# Patient Record
Sex: Female | Born: 1960 | Race: Black or African American | Hispanic: No | Marital: Single | Smoking: Former smoker
Health system: Southern US, Community
[De-identification: ages and names within clinical notes are randomized; demographics above are authoritative.]

## PROBLEM LIST (undated history)

## (undated) DIAGNOSIS — F32A Depression, unspecified: Secondary | ICD-10-CM

## (undated) DIAGNOSIS — F329 Major depressive disorder, single episode, unspecified: Secondary | ICD-10-CM

## (undated) HISTORY — DX: Depression, unspecified: F32.A

---
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
## 2010-03-09 ENCOUNTER — Emergency Department (HOSPITAL_BASED_OUTPATIENT_CLINIC_OR_DEPARTMENT_OTHER): Admission: EM | Admit: 2010-03-09 | Discharge: 2010-03-09 | Payer: Self-pay | Admitting: Emergency Medicine

## 2010-10-28 ENCOUNTER — Emergency Department (HOSPITAL_BASED_OUTPATIENT_CLINIC_OR_DEPARTMENT_OTHER)
Admission: EM | Admit: 2010-10-28 | Discharge: 2010-10-28 | Disposition: A | Discharge status: Home / Self Care | Payer: Medicaid Other | Attending: Emergency Medicine | Admitting: Emergency Medicine

## 2010-10-28 DIAGNOSIS — IMO0002 Reserved for concepts with insufficient information to code with codable children: Secondary | ICD-10-CM | POA: Insufficient documentation

## 2010-10-28 DIAGNOSIS — Y9210 Unspecified residential institution as the place of occurrence of the external cause: Secondary | ICD-10-CM | POA: Insufficient documentation

## 2010-10-28 DIAGNOSIS — F17200 Nicotine dependence, unspecified, uncomplicated: Secondary | ICD-10-CM | POA: Insufficient documentation

## 2010-10-28 DIAGNOSIS — M25539 Pain in unspecified wrist: Secondary | ICD-10-CM | POA: Insufficient documentation

## 2011-07-01 ENCOUNTER — Emergency Department (HOSPITAL_BASED_OUTPATIENT_CLINIC_OR_DEPARTMENT_OTHER)
Admission: EM | Admit: 2011-07-01 | Discharge: 2011-07-01 | Disposition: A | Discharge status: Home / Self Care | Payer: Self-pay | Attending: Emergency Medicine | Admitting: Emergency Medicine

## 2011-07-01 ENCOUNTER — Other Ambulatory Visit: Payer: Self-pay

## 2011-07-01 ENCOUNTER — Emergency Department (INDEPENDENT_AMBULATORY_CARE_PROVIDER_SITE_OTHER): Payer: Self-pay

## 2011-07-01 ENCOUNTER — Encounter (HOSPITAL_BASED_OUTPATIENT_CLINIC_OR_DEPARTMENT_OTHER): Payer: Self-pay | Admitting: *Deleted

## 2011-07-01 DIAGNOSIS — R059 Cough, unspecified: Secondary | ICD-10-CM | POA: Insufficient documentation

## 2011-07-01 DIAGNOSIS — R079 Chest pain, unspecified: Secondary | ICD-10-CM

## 2011-07-01 DIAGNOSIS — R071 Chest pain on breathing: Secondary | ICD-10-CM | POA: Insufficient documentation

## 2011-07-01 DIAGNOSIS — R05 Cough: Secondary | ICD-10-CM | POA: Insufficient documentation

## 2011-07-01 DIAGNOSIS — R0789 Other chest pain: Secondary | ICD-10-CM

## 2011-07-01 LAB — DIFFERENTIAL
Basophils Absolute: 0 10*3/uL (ref 0.0–0.1)
Basophils Relative: 0 % (ref 0–1)
Eosinophils Absolute: 0 10*3/uL (ref 0.0–0.7)
Eosinophils Relative: 0 % (ref 0–5)
Lymphocytes Relative: 31 % (ref 12–46)
Monocytes Absolute: 0.7 10*3/uL (ref 0.1–1.0)

## 2011-07-01 LAB — CBC
HCT: 32.7 % — ABNORMAL LOW (ref 36.0–46.0)
MCH: 30.7 pg (ref 26.0–34.0)
MCHC: 34.9 g/dL (ref 30.0–36.0)
MCV: 88.1 fL (ref 78.0–100.0)
Platelets: 282 10*3/uL (ref 150–400)
RDW: 13.5 % (ref 11.5–15.5)

## 2011-07-01 LAB — BASIC METABOLIC PANEL
CO2: 25 mEq/L (ref 19–32)
Calcium: 10.1 mg/dL (ref 8.4–10.5)
Creatinine, Ser: 0.6 mg/dL (ref 0.50–1.10)
GFR calc non Af Amer: >90 mL/min (ref >90)
Sodium: 138 mEq/L (ref 135–145)

## 2011-07-01 LAB — CARDIAC PANEL(CRET KIN+CKTOT+MB+TROPI): Total CK: 421 U/L — ABNORMAL HIGH (ref 7–177)

## 2011-07-01 MED ORDER — TRAMADOL HCL 50 MG PO TABS: 50.0000 mg | ORAL_TABLET | Freq: Four times a day (QID) | ORAL | Status: AC | PRN

## 2011-07-01 MED ORDER — IBUPROFEN 800 MG PO TABS
800.0000 mg | ORAL_TABLET | Freq: Once | ORAL | Status: AC
  Administered 2011-07-01: 800 mg via ORAL
  Filled 2011-07-01: qty 1

## 2011-07-01 MED ORDER — NAPROXEN 500 MG PO TABS: 500.0000 mg | ORAL_TABLET | Freq: Two times a day (BID) | ORAL | Status: AC

## 2011-07-01 MED ORDER — BENZONATATE 100 MG PO CAPS: 100.0000 mg | ORAL_CAPSULE | Freq: Three times a day (TID) | ORAL | Status: AC

## 2011-07-01 NOTE — Discharge Instructions (Signed)
Chest Wall Pain Chest wall pain is pain in or around the bones and muscles of your chest. This may occur:   On its own (spontaneously).   After a viral illness such as the flu.   Through injur.   From coughing.   Minor exercise.  It may take up to 6 weeks to get better; longer if you must stay physically active in your work and activities. HOME CARE INSTRUCTIONS   Avoid over-tiring physical activity. Try not to strain or perform activities which cause pain. This would include any activities using chest, belly (abdominal) and side muscles, especially if heavy weights are used.   Use ice on the painful area for 15 to 20 minutes per hour while awake for the first 2 days. Place the ice in a plastic bag and place a towel between the bag of ice and your skin.   Only take over-the-counter or prescription medicines for pain, discomfort, or fever as directed by your caregiver.  SEEK IMMEDIATE MEDICAL CARE IF:   Your pain increases or you are very uncomfortable.   An oral temperature above 102 F (38.9 C)develops.   Your chest pains become worse.   You develop new, unexplained problems (symptoms).   You develop nausea, vomiting, sweating or feel light headed.   You develop a cough which produces phlegm (sputum) or you cough up blood.  MAKE SURE YOU:   Understand these instructions.   Will watch your condition.   Will get help right away if you are not doing well or get worse.  Document Released: 04/17/2005 Document Revised: 10/31/2010 Document Reviewed: 12/04/2007 Exit[employer] Patient Information 2012 [employer]. 

## 2011-07-01 NOTE — ED Notes (Signed)
Pt states she has had a cough for 3 weeks. 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 Increased with movement. Denies other s/s.

## 2011-07-01 NOTE — ED Provider Notes (Signed)
History   This chart was scribed for Dayton Bailiff, MD by Charolett Bumpers . The patient was seen in room MH11/MH11 and the patient's care was started at 4:00pm.   CSN: [id number]  Arrival date & time 07/01/11  1539   First MD Initiated Contact with Patient 07/01/11 1557      Chief Complaint  Patient presents with  . Chest Pain    (Consider location/radiation/quality/duration/timing/severity/associated sxs/prior treatment) HPI Carol Ramirez is a 51 y.o. female who presents to the Emergency Department complaining of constant, moderate right-sided chest pain that started earlier today with sudden onset. Patient reports that she does a lot of lifting at work. Patient states that the chest pain is made worse with movements and lifting, but is not made worse with breathing. Patient states that she has had a cough for the past 3 weeks that has not improved. Patient reports no other associated symptoms. No pertinent medical hx noted.  Presents because she is extra concerned because she recently lost her aunt to a massive MI.   History reviewed. No pertinent past medical history.  History reviewed. No pertinent past surgical history.  History reviewed. No pertinent family history.  History  Substance Use Topics  . Smoking status: Current Some Day Smoker  . Smokeless tobacco: Not on file  . Alcohol Use: Yes    OB History    Grav Para Term Preterm Abortions TAB SAB Ect Mult Living                  Review of Systems  Constitutional: Negative for fever, chills, activity change, appetite change and fatigue.  HENT: Negative for congestion, sore throat, rhinorrhea, neck pain and neck stiffness.   Respiratory: Negative for cough and shortness of breath.   Cardiovascular: Positive for chest pain. Negative for palpitations.  Gastrointestinal: Negative for nausea, vomiting and abdominal pain.  Genitourinary: Negative for dysuria, urgency, frequency and flank pain.  Musculoskeletal:  Negative for myalgias, back pain and arthralgias.  Neurological: Negative for dizziness, weakness, light-headedness, numbness and headaches.  All other systems reviewed and are negative.   A complete 10 system review of systems was obtained and is otherwise negative except as noted in the HPI and PMH.   Allergies  Review of patient's allergies indicates no known allergies.  Home Medications   Current Outpatient Rx  Name Route Sig Dispense Refill  . ADULT MULTIVITAMIN W/MINERALS CH Oral Take 1 tablet by mouth daily.    Marland Kitchen NAPROXEN 500 MG PO TABS Oral Take 1 tablet (500 mg total) by mouth 2 (two) times daily. 30 tablet 0  . TRAMADOL HCL 50 MG PO TABS Oral Take 1 tablet (50 mg total) by mouth every 6 (six) hours as needed for pain. 15 tablet 0    BP 162/87  Pulse 86  Temp(Src) 98.5 F (36.9 C) (Oral)  Resp 20  Ht 5\' 4"  (1.626 m)  Wt 149 lb (67.586 kg)  BMI 25.58 kg/m2  SpO2 100%  LMP 06/23/2011  Physical Exam  Nursing note and vitals reviewed. Constitutional: She is oriented to person, place, and time. She appears well-developed and well-nourished. No distress.  HENT:  Head: Normocephalic and atraumatic.  Eyes: EOM are normal. Pupils are equal, round, and reactive to light.  Neck: Normal range of motion. Neck supple. No tracheal deviation present.  Cardiovascular: Normal rate, regular rhythm and normal heart sounds.  Exam reveals no gallop and no friction rub.   No murmur heard. Pulmonary/Chest: Effort normal and  breath sounds normal. No respiratory distress. She has no wheezes. She has no rales. She exhibits tenderness (Right-sided chest tenderness. ).  Abdominal: Soft. Bowel sounds are normal. She exhibits no distension. There is no tenderness.  Musculoskeletal: Normal range of motion. She exhibits no edema.  Neurological: She is alert and oriented to person, place, and time. No sensory deficit.  Skin: Skin is warm and dry.  Psychiatric: She has a normal mood and affect. Her  behavior is normal.    ED Course  Procedures (including critical care time)   Date: 07/01/2011  Rate: 85  Rhythm: normal sinus rhythm  QRS Axis: normal  Intervals: normal  ST/T Wave abnormalities: normal  Conduction Disutrbances:none  Narrative Interpretation:   Old EKG Reviewed: none available  DIAGNOSTIC STUDIES: Oxygen Saturation is 100% on room air, normal by my interpretation.    COORDINATION OF CARE:  1615: Medication Orders: Ibuprofen tablet 800 mg-once.  1730: Recheck: Patient was informed of imaging and lab results.   Labs Reviewed  CBC - Abnormal; Notable for the following:    RBC 3.71 (*)    Hemoglobin 11.4 (*)    HCT 32.7 (*)    All other components within normal limits  CARDIAC PANEL(CRET KIN+CKTOT+MB+TROPI) - Abnormal; Notable for the following:    Total CK 421 (*)    CK, MB 4.2 (*)    All other components within normal limits  DIFFERENTIAL  BASIC METABOLIC PANEL   Dg Chest 2 View  07/01/2011  *RADIOLOGY REPORT*  Clinical Data: Chest pain.  Occasional smoker.  CHEST - 2 VIEW  Comparison: None  Findings: The heart size and mediastinal contours are within normal limits.  Both lungs are clear.  The visualized skeletal structures are unremarkable.  IMPRESSION: Negative exam.  Original Report Authenticated By: Rosealee Albee, M.D.     1. Chest wall pain       MDM  Consistent with chest wall pain. The patient has PE RC negative with low clinical Gestalt for pulmonary embolus. No concern for ACS. Single troponin was performed and negative. She had pain for greater than 6 hours continuously. She'll be discharged home with pain medication. Instructed to followup with her primary care physician   I personally performed the services described in this documentation, which was scribed in my presence. The recorded information has been reviewed and considered.    Dayton Bailiff, MD 07/01/11 [phone]

## 2012-12-18 ENCOUNTER — Emergency Department (HOSPITAL_BASED_OUTPATIENT_CLINIC_OR_DEPARTMENT_OTHER)
Admission: EM | Admit: 2012-12-18 | Discharge: 2012-12-18 | Disposition: A | Discharge status: Home / Self Care | Payer: Medicaid Other | Attending: Emergency Medicine | Admitting: Emergency Medicine

## 2012-12-18 ENCOUNTER — Encounter (HOSPITAL_BASED_OUTPATIENT_CLINIC_OR_DEPARTMENT_OTHER): Payer: Self-pay | Admitting: Emergency Medicine

## 2012-12-18 DIAGNOSIS — Z87891 Personal history of nicotine dependence: Secondary | ICD-10-CM | POA: Insufficient documentation

## 2012-12-18 DIAGNOSIS — Z8669 Personal history of other diseases of the nervous system and sense organs: Secondary | ICD-10-CM | POA: Insufficient documentation

## 2012-12-18 DIAGNOSIS — M79641 Pain in right hand: Secondary | ICD-10-CM

## 2012-12-18 DIAGNOSIS — M25449 Effusion, unspecified hand: Secondary | ICD-10-CM | POA: Insufficient documentation

## 2012-12-18 DIAGNOSIS — IMO0001 Reserved for inherently not codable concepts without codable children: Secondary | ICD-10-CM | POA: Insufficient documentation

## 2012-12-18 DIAGNOSIS — Z79899 Other long term (current) drug therapy: Secondary | ICD-10-CM | POA: Insufficient documentation

## 2012-12-18 DIAGNOSIS — M79609 Pain in unspecified limb: Secondary | ICD-10-CM | POA: Insufficient documentation

## 2012-12-18 MED ORDER — IBUPROFEN 800 MG PO TABS
800.0000 mg | ORAL_TABLET | Freq: Once | ORAL | Status: AC
  Administered 2012-12-18: 800 mg via ORAL
  Filled 2012-12-18: qty 1

## 2012-12-18 MED ORDER — NAPROXEN 500 MG PO TABS: 500.0000 mg | ORAL_TABLET | Freq: Two times a day (BID) | ORAL | Status: DC

## 2012-12-18 NOTE — ED Notes (Signed)
Complaining of bilateral hand numbness(thinks is carpal tunnel) has repetitive motion work lifting.

## 2012-12-18 NOTE — ED Provider Notes (Signed)
  CSN: [id number]     Arrival date & time 12/18/12  1428 History     First MD Initiated Contact with Patient 12/18/12 1454     Chief Complaint  Patient presents with  . Hand Pain   (Consider location/radiation/quality/duration/timing/severity/associated sxs/prior Treatment) Patient is a 52 y.o. female presenting with hand pain. The history is provided by the patient. No language interpreter was used.  Hand Pain This is a new problem. The current episode started 1 to 4 weeks ago. The problem occurs constantly. The problem has been gradually worsening. Associated symptoms include joint swelling and myalgias. Nothing aggravates the symptoms. She has tried nothing for the symptoms. The treatment provided moderate relief.  Pt has a history of carpal tunnel.  Pt saw hand md in the past.    History reviewed. No pertinent past medical history. History reviewed. No pertinent past surgical history. No family history on file. History  Substance Use Topics  . Smoking status: Former Smoker    Types: Cigarettes  . Smokeless tobacco: Not on file  . Alcohol Use: 1.2 oz/week    2 Cans of beer per week     Comment: weekly   OB History   Grav Para Term Preterm Abortions TAB SAB Ect Mult Living                 Review of Systems  Musculoskeletal: Positive for myalgias and joint swelling.  All other systems reviewed and are negative.    Allergies  Review of patient's allergies indicates no known allergies.  Home Medications   Current Outpatient Rx  Name  Route  Sig  Dispense  Refill  . Multiple Vitamin (MULITIVITAMIN WITH MINERALS) TABS   Oral   Take 1 tablet by mouth daily.          BP 170/84  Pulse 70  Temp(Src) 98.1 F (36.7 C) (Oral)  Resp 16  Ht 5\' 4"  (1.626 m)  Wt 152 lb (68.947 kg)  BMI 26.08 kg/m2  SpO2 100%  LMP 12/18/2012 Physical Exam  Vitals reviewed. Constitutional: She is oriented to person, place, and time. She appears well-developed and well-nourished.   Musculoskeletal: She exhibits tenderness.  Swollen bilat hands,  No erythema,   From,  nv and ns intact  Neurological: She is alert and oriented to person, place, and time. She has normal reflexes.  Skin: Skin is warm.  Psychiatric: She has a normal mood and affect.    ED Course   Procedures (including critical care time)  Labs Reviewed - No data to display No results found. 1. Bilateral hand pain     MDM  Pt advised to wear wrist splints.  Follow up with Hand surgeon for evaluation   Elson Areas, PA-C 12/18/12 1529

## 2012-12-19 NOTE — ED Provider Notes (Signed)
History/physical exam/procedure(s) were performed by non-physician practitioner and as supervising physician I was immediately available for consultation/collaboration. I have reviewed all notes and am in agreement with care and plan.   Hilario Quarry, MD 12/19/12 [phone]

## 2018-02-28 ENCOUNTER — Encounter: Payer: Self-pay | Admitting: Family Medicine

## 2018-09-06 ENCOUNTER — Ambulatory Visit (INDEPENDENT_AMBULATORY_CARE_PROVIDER_SITE_OTHER): Payer: BLUE CROSS/BLUE SHIELD | Admitting: Family Medicine

## 2018-09-06 ENCOUNTER — Other Ambulatory Visit: Payer: Self-pay

## 2018-09-06 ENCOUNTER — Encounter: Payer: Self-pay | Admitting: Family Medicine

## 2018-09-06 VITALS — BP 132/76 | HR 88 | Ht 63.5 in | Wt 136.0 lb

## 2018-09-06 DIAGNOSIS — Z1151 Encounter for screening for human papillomavirus (HPV): Secondary | ICD-10-CM

## 2018-09-06 DIAGNOSIS — Z124 Encounter for screening for malignant neoplasm of cervix: Secondary | ICD-10-CM

## 2018-09-06 DIAGNOSIS — Z01419 Encounter for gynecological examination (general) (routine) without abnormal findings: Secondary | ICD-10-CM | POA: Diagnosis not present

## 2018-09-06 NOTE — Progress Notes (Signed)
GYNECOLOGY ANNUAL PREVENTATIVE CARE ENCOUNTER NOTE  Subjective:   Carol Ramirez is a 58 y.o. G7P1001 female here for a routine annual gynecologic exam.  Current complaints: none.   Denies abnormal vaginal bleeding, discharge, pelvic pain, problems with intercourse or other gynecologic concerns.    Gynecologic History No LMP recorded. (Menstrual status: Perimenopausal). Patient is sexually active  Contraception: post menopausal status Last Pap: uncertain, but several years ago. Results were: normal Last mammogram: none  Obstetric History OB History  Gravida Para Term Preterm AB Living  '1 1 1     1  ' SAB TAB Ectopic Multiple Live Births          1    # Outcome Date GA Lbr Len/2nd Weight Sex Delivery Anes PTL Lv  1 Term 1996 [redacted]w[redacted]d  M CS-LTranv EPI N LIV    History reviewed. No pertinent past medical history.  History reviewed. No pertinent surgical history.  Current Outpatient Medications on File Prior to Visit  Medication Sig Dispense Refill  . Multiple Vitamin (MULITIVITAMIN WITH MINERALS) TABS Take 1 tablet by mouth daily.    . naproxen (NAPROSYN) 500 MG tablet Take 1 tablet (500 mg total) by mouth 2 (two) times daily. (Patient not taking: Reported on 09/06/2018) 30 tablet 0   No current facility-administered medications on file prior to visit.     No Known Allergies  Social History   Socioeconomic History  . Marital status: Single    Spouse name: Not on file  . Number of children: Not on file  . Years of education: Not on file  . Highest education level: Not on file  Occupational History  . Not on file  Social Needs  . Financial resource strain: Not on file  . Food insecurity:    Worry: Not on file    Inability: Not on file  . Transportation needs:    Medical: Not on file    Non-medical: Not on file  Tobacco Use  . Smoking status: Former Smoker    Types: Cigarettes  . Smokeless tobacco: Never Used  Substance and Sexual Activity  . Alcohol use: Yes     Alcohol/week: 2.0 standard drinks    Types: 2 Cans of beer per week    Comment: weekly  . Drug use: No  . Sexual activity: Yes    Birth control/protection: None  Lifestyle  . Physical activity:    Days per week: Not on file    Minutes per session: Not on file  . Stress: Not on file  Relationships  . Social connections:    Talks on phone: Not on file    Gets together: Not on file    Attends religious service: Not on file    Active member of club or organization: Not on file    Attends meetings of clubs or organizations: Not on file    Relationship status: Not on file  . Intimate partner violence:    Fear of current or ex partner: Not on file    Emotionally abused: Not on file    Physically abused: Not on file    Forced sexual activity: Not on file  Other Topics Concern  . Not on file  Social History Narrative  . Not on file    Family History  Problem Relation Age of Onset  . Diabetes Mother     The following portions of the patient's history were reviewed and updated as appropriate: allergies, current medications, past family history, past  medical history, past social history, past surgical history and problem list.  Review of Systems Pertinent items are noted in HPI.   Objective:  BP 132/76   Pulse 88   Ht 5' 3.5" (1.613 m)   Wt 136 lb (61.7 kg)   BMI 23.71 kg/m  Wt Readings from Last 3 Encounters:  09/06/18 136 lb (61.7 kg)  12/18/12 152 lb (68.9 kg)  07/01/11 149 lb (67.6 kg)     CONSTITUTIONAL: Well-developed, well-nourished female in no acute distress.  HENT:  Normocephalic, atraumatic, External right and left ear normal. Oropharynx is clear and moist EYES: Conjunctivae and EOM are normal. Pupils are equal, round, and reactive to light. No scleral icterus.  NECK: Normal range of motion, supple, no masses.  Normal thyroid.   CARDIOVASCULAR: Normal heart rate noted, regular rhythm RESPIRATORY: Clear to auscultation bilaterally. Effort and breath sounds  normal, no problems with respiration noted. BREASTS: Symmetric in size. No masses, skin changes, nipple drainage, or lymphadenopathy. ABDOMEN: Soft, normal bowel sounds, no distention noted.  No tenderness, rebound or guarding.  PELVIC: Normal appearing external genitalia; normal appearing vaginal mucosa and cervix.  No abnormal discharge noted.  Normal uterine size, no other palpable masses, no uterine or adnexal tenderness. MUSCULOSKELETAL: Normal range of motion. No tenderness.  No cyanosis, clubbing, or edema.  2+ distal pulses. SKIN: Skin is warm and dry. No rash noted. Not diaphoretic. No erythema. No pallor. NEUROLOGIC: Alert and oriented to person, place, and time. Normal reflexes, muscle tone coordination. No cranial nerve deficit noted. PSYCHIATRIC: Normal mood and affect. Normal behavior. Normal judgment and thought content.  Assessment:  Annual gynecologic examination with pap smear   Plan:  1. Well Woman Exam Will follow up results of pap smear and manage accordingly. Mammogram scheduled - MM DIGITAL SCREENING BILATERAL; Future - Cytology - PAP( [place]) - CBC - Comp Met ([facility]) - Thyroid Panel With TSH   Routine preventative health maintenance measures emphasized. Please refer to After Visit Summary for other counseling recommendations.    Loma Boston, [place] for [employer]

## 2018-09-06 NOTE — Progress Notes (Signed)
Pt needs a pap smear and mammogram.  Thereasa Iannello l Merlin Ege, CMA

## 2018-09-07 LAB — CBC
Hematocrit: 32.1 % — ABNORMAL LOW (ref 34.0–46.6)
Hemoglobin: 11.4 g/dL (ref 11.1–15.9)
MCH: 31.8 pg (ref 26.6–33.0)
MCHC: 35.5 g/dL (ref 31.5–35.7)
MCV: 89 fL (ref 79–97)
Platelets: 270 10*3/uL (ref 150–450)
RBC: 3.59 x10E6/uL — ABNORMAL LOW (ref 3.77–5.28)
RDW: 13.8 % (ref 11.7–15.4)
WBC: 5.9 10*3/uL (ref 3.4–10.8)

## 2018-09-07 LAB — COMPREHENSIVE METABOLIC PANEL
ALT: 33 IU/L — ABNORMAL HIGH (ref 0–32)
AST: 49 IU/L — ABNORMAL HIGH (ref 0–40)
Albumin/Globulin Ratio: 1.5 (ref 1.2–2.2)
Albumin: 4.7 g/dL (ref 3.8–4.9)
Alkaline Phosphatase: 73 IU/L (ref 39–117)
BUN/Creatinine Ratio: 12 (ref 9–23)
BUN: 9 mg/dL (ref 6–24)
Bilirubin Total: 0.4 mg/dL (ref 0.0–1.2)
CO2: 20 mmol/L (ref 20–29)
Calcium: 9.8 mg/dL (ref 8.7–10.2)
Chloride: 102 mmol/L (ref 96–106)
Creatinine, Ser: 0.73 mg/dL (ref 0.57–1.00)
GFR calc Af Amer: 107 mL/min/{1.73_m2} (ref >59)
GFR calc non Af Amer: 93 mL/min/{1.73_m2} (ref >59)
Globulin, Total: 3.2 g/dL (ref 1.5–4.5)
Glucose: 98 mg/dL (ref 65–99)
Potassium: 3.8 mmol/L (ref 3.5–5.2)
Sodium: 140 mmol/L (ref 134–144)
Total Protein: 7.9 g/dL (ref 6.0–8.5)

## 2018-09-07 LAB — THYROID PANEL WITH TSH
Free Thyroxine Index: 1.5 (ref 1.2–4.9)
T3 Uptake Ratio: 26 % (ref 24–39)
T4, Total: 5.8 ug/dL (ref 4.5–12.0)
TSH: 3.71 u[IU]/mL (ref 0.450–4.500)

## 2018-09-11 ENCOUNTER — Telehealth: Payer: Self-pay

## 2018-09-11 DIAGNOSIS — R7989 Other specified abnormal findings of blood chemistry: Secondary | ICD-10-CM

## 2018-09-11 LAB — CYTOLOGY - PAP
Adequacy: ABSENT
Diagnosis: NEGATIVE
HPV: NOT DETECTED

## 2018-09-11 NOTE — Telephone Encounter (Signed)
-----   Message from Levie Heritage, DO sent at 09/10/2018  3:22 PM EDT ----- LFTs elevated. She should see a PCP in order to get follow up testing done.

## 2018-09-11 NOTE — Telephone Encounter (Signed)
Called pt with results . Pt made aware that a referral for Primary care was sent to [facility]. Advised pt to call their office if she doesn't receive a call within the next few days.  Understanding was voiced.  chiquita l wilson, CMA

## 2018-09-16 ENCOUNTER — Telehealth: Payer: Self-pay

## 2018-09-16 DIAGNOSIS — B9689 Other specified bacterial agents as the cause of diseases classified elsewhere: Secondary | ICD-10-CM

## 2018-09-16 MED ORDER — METRONIDAZOLE 500 MG PO TABS: 500.0000 mg | ORAL_TABLET | Freq: Two times a day (BID) | ORAL | 0 refills | Status: DC

## 2018-09-16 NOTE — Telephone Encounter (Signed)
Called pt with Pap results. Pt made aware that her pap is negative but she has BV. Medication was sent to pharmacy.  Carol Ramirez l Carol Ramirez, CMA

## 2018-09-19 ENCOUNTER — Inpatient Hospital Stay (HOSPITAL_BASED_OUTPATIENT_CLINIC_OR_DEPARTMENT_OTHER): Admission: RE | Admit: 2018-09-19 | Payer: BLUE CROSS/BLUE SHIELD | Source: Ambulatory Visit

## 2018-09-24 ENCOUNTER — Telehealth: Payer: Self-pay | Admitting: General Practice

## 2018-09-24 NOTE — Telephone Encounter (Signed)
LVM on cell phone for pt to call the office and schedule New pt appt. Pt wanted to schedule new pt appt per her OBGYN in our office. Please schedule with provider Carmelia Roller, [place] or [place] who are taking new pts.

## 2018-10-09 ENCOUNTER — Ambulatory Visit: Payer: BLUE CROSS/BLUE SHIELD | Admitting: Medical

## 2018-10-09 ENCOUNTER — Encounter: Payer: Self-pay | Admitting: Medical

## 2018-10-09 ENCOUNTER — Other Ambulatory Visit: Payer: Self-pay

## 2018-10-09 VITALS — BP 145/85 | HR 74 | Temp 98.6°F | Resp 16 | Ht 64.0 in | Wt 136.0 lb

## 2018-10-09 DIAGNOSIS — R748 Abnormal levels of other serum enzymes: Secondary | ICD-10-CM

## 2018-10-09 DIAGNOSIS — I10 Essential (primary) hypertension: Secondary | ICD-10-CM

## 2018-10-09 LAB — COMPREHENSIVE METABOLIC PANEL
ALT: 28 U/L (ref 0–35)
AST: 41 U/L — ABNORMAL HIGH (ref 0–37)
Albumin: 4.4 g/dL (ref 3.5–5.2)
Alkaline Phosphatase: 66 U/L (ref 39–117)
BUN: 8 mg/dL (ref 6–23)
CO2: 23 mEq/L (ref 19–32)
Calcium: 9.6 mg/dL (ref 8.4–10.5)
Chloride: 102 mEq/L (ref 96–112)
Creatinine, Ser: 0.59 mg/dL (ref 0.40–1.20)
GFR: 127.67 mL/min (ref >60.00)
Glucose, Bld: 94 mg/dL (ref 70–99)
Potassium: 3.5 mEq/L (ref 3.5–5.1)
Sodium: 136 mEq/L (ref 135–145)
Total Bilirubin: 0.4 mg/dL (ref 0.2–1.2)
Total Protein: 7.6 g/dL (ref 6.0–8.3)

## 2018-10-09 LAB — GAMMA GT: GGT: 93 U/L — ABNORMAL HIGH (ref 7–51)

## 2018-10-09 NOTE — Patient Instructions (Addendum)
For your elevated liver enzymes we will get cmp, ggt, hepatitis panel and abd [place] today. I want you to stop drinking alcohol or at least cut back to just one beer a day. If a lot of fat on liver recommend stop completely.  BP elevaated. Check bp daily and bring in readings on follow up. If bp over 140/90 will rx meds. Eat low salt diet. Pt fasting today so will get lipid panel since not done on exam with gyn  Follow up in 10 days or sooner if needed

## 2018-10-09 NOTE — Progress Notes (Signed)
Subjective:    Patient ID: Carol ShinglesBridgette D Ramirez, female    DOB: Mar 26, 1961, 58 y.o.   MRN: [id number]  HPI  Pt has first visit with our office today.  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 Does not work out regularly. Does drink alcohol. Pt stopped smoking one year. Smoked for 10 years. States eat healthy. Drinks sweet tea. Single. One son 623 yo.  Pt had recent wellness exam with gyn. Sent to see us since liver enzymes are mild elevated. Pt drinks about 3 beer a day since 58 yo. Occasional will drink 40 oz beer or 6 pack. No pain in ruq area. No abdomen bloating. No abd pain.   Mild trace anemia.   Thyroid studies recently normal  Pt bp is high today initially. Pt bp has been elevated occasionally in the past. No cardiac or neurologic signs or symptoms.     Review of Systems  Constitutional: Negative for chills, fatigue and fever.  HENT: Negative for congestion, ear discharge and ear pain.   Respiratory: Negative for cough, chest tightness, shortness of breath and wheezing.   Cardiovascular: Negative for chest pain and palpitations.  Gastrointestinal: Negative for abdominal pain.  Genitourinary: Negative for dysuria, flank pain, frequency and urgency.  Musculoskeletal: Negative for back pain.  Skin: Negative for rash.  Neurological: Negative for dizziness, numbness and headaches.  Hematological: Negative for adenopathy. Does not bruise/bleed easily.  Psychiatric/Behavioral: Negative for behavioral problems and confusion.   Past Medical History:  Diagnosis Date  . Depression    briefly in year 2000 releated to work and death of family members.     Social History   Socioeconomic History  . Marital status: Single    Spouse name: Not on file  . Number of children: Not on file  . Years of education: Not on file  . Highest education level: Not on file  Occupational History  . Occupation: Designer, fashion/clothingline cook.  Social Needs  . Financial resource strain: Not on file  . Food insecurity:   Worry: Not on file    Inability: Not on file  . Transportation needs:    Medical: Not on file    Non-medical: Not on file  Tobacco Use  . Smoking status: Former Smoker    Packs/day: 0.25    Years: 10.00    Pack years: 2.50    Types: Cigarettes    Last attempt to quit: 08/08/2008    Years since quitting: 10.1  . Smokeless tobacco: Never Used  Substance and Sexual Activity  . Alcohol use: Yes    Alcohol/week: 3.0 standard drinks    Types: 3 Cans of beer per week    Comment: per day  . Drug use: No  . Sexual activity: Yes    Birth control/protection: None  Lifestyle  . Physical activity:    Days per week: Not on file    Minutes per session: Not on file  . Stress: Not on file  Relationships  . Social connections:    Talks on phone: Not on file    Gets together: Not on file    Attends religious service: Not on file    Active member of club or organization: Not on file    Attends meetings of clubs or organizations: Not on file    Relationship status: Not on file  . Intimate partner violence:    Fear of current or ex partner: Not on file    Emotionally abused: Not on file    Physically abused:  Not on file    Forced sexual activity: Not on file  Other Topics Concern  . Not on file  Social History Narrative  . Not on file    Past Surgical History:  Procedure Laterality Date  . CESAREAN SECTION      Family History  Problem Relation Age of Onset  . Diabetes Mother     No Known Allergies  No current outpatient medications on file prior to visit.   No current facility-administered medications on file prior to visit.     BP (!) 145/85   Pulse 74   Temp 98.6 F (37 C) (Oral)   Resp 16   Ht 5\' 4"  (1.626 m)   Wt 136 lb (61.7 kg)   SpO2 100%   BMI 23.34 kg/m       Objective:   Physical Exam  General Mental Status- Alert. General Appearance- Not in acute distress.   Skin General: Color- Normal Color. Moisture- Normal Moisture.  Neck Carotid Arteries-  Normal color. Moisture- Normal Moisture. No carotid bruits. No JVD.  Chest and Lung Exam Auscultation: Breath Sounds:-Normal.  Cardiovascular Auscultation:Rythm- Regular. Murmurs & Other Heart Sounds:Auscultation of the heart reveals- No Murmurs.  Abdomen Inspection:-Inspeection Normal. Palpation/Percussion:Note:No mass. Palpation and Percussion of the abdomen reveal- Non Tender, Non Distended + BS, no rebound or guarding.   Neurologic Cranial Nerve exam:- CN III-XII intact(No nystagmus), symmetric smile. Strength:- 5/5 equal and symmetric strength both upper and lower extremities.      Assessment & Plan:  For your elevated liver enzymes we will get cmp, ggt, hepatitis panel and abd [place] today. I want you to stop drinking alcohol or at least cut back to just one beer a day. If a lot of fat on liver recommend stop completely.  BP elevaated. Check bp daily and bring in readings on follow up. If bp over 140/90 will rx meds. Eat low salt diet.  Follow up in 10 days or sooner if needed  [employer], PA-C

## 2018-10-10 LAB — HEPATITIS B E ANTIBODY: Hep B E Ab: NONREACTIVE

## 2018-10-10 LAB — HEPATITIS B CORE ANTIBODY, TOTAL: Hep B Core Total Ab: NONREACTIVE

## 2018-10-10 LAB — HEPATITIS A ANTIBODY, TOTAL: Hepatitis A AB,Total: REACTIVE — AB

## 2018-10-10 LAB — HEPATITIS B SURFACE ANTIBODY,QUALITATIVE: Hep B S Ab: NONREACTIVE

## 2018-10-10 LAB — HEPATITIS C ANTIBODY
Hepatitis C Ab: NONREACTIVE
SIGNAL TO CUT-OFF: 0.01 (ref <1.00)

## 2018-10-14 ENCOUNTER — Other Ambulatory Visit: Payer: Self-pay

## 2018-10-14 ENCOUNTER — Ambulatory Visit (HOSPITAL_BASED_OUTPATIENT_CLINIC_OR_DEPARTMENT_OTHER)
Admission: RE | Admit: 2018-10-14 | Discharge: 2018-10-14 | Disposition: A | Discharge status: Home / Self Care | Payer: BLUE CROSS/BLUE SHIELD | Source: Ambulatory Visit | Attending: Medical | Admitting: Medical

## 2018-10-14 ENCOUNTER — Ambulatory Visit (HOSPITAL_BASED_OUTPATIENT_CLINIC_OR_DEPARTMENT_OTHER): Payer: BLUE CROSS/BLUE SHIELD

## 2018-10-14 DIAGNOSIS — R748 Abnormal levels of other serum enzymes: Secondary | ICD-10-CM | POA: Diagnosis present

## 2018-10-17 ENCOUNTER — Ambulatory Visit: Payer: BLUE CROSS/BLUE SHIELD | Admitting: Medical

## 2018-10-18 ENCOUNTER — Ambulatory Visit: Payer: BLUE CROSS/BLUE SHIELD | Admitting: Medical

## 2018-10-26 NOTE — Progress Notes (Signed)
   Subjective:    Patient ID: Carol Ramirez, female    DOB: 08/11/1960, 58 y.o.   MRN: [id number]  HPI     Review of Systems         Objective:           Assessment & Plan:

## 2018-10-28 ENCOUNTER — Ambulatory Visit: Payer: BLUE CROSS/BLUE SHIELD | Admitting: Medical

## 2018-10-30 ENCOUNTER — Other Ambulatory Visit: Payer: Self-pay

## 2018-11-05 ENCOUNTER — Ambulatory Visit: Payer: BLUE CROSS/BLUE SHIELD | Admitting: Medical

## 2019-07-30 IMAGING — US ULTRASOUND ABDOMEN LIMITED
1 series · 14 of 25 positions shown · non-contrast
Comparison: None.

CLINICAL DATA: Elevated liver function tests.

EXAM:
ULTRASOUND ABDOMEN LIMITED RIGHT UPPER QUADRANT

[Series 1: ultrasound abdomen limited · 14 of 70 slices shown]
[im 1/70]
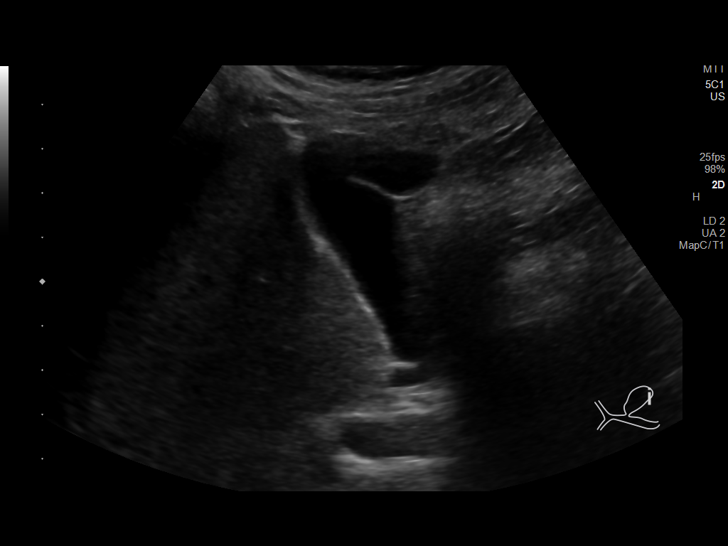
[im 6/70]
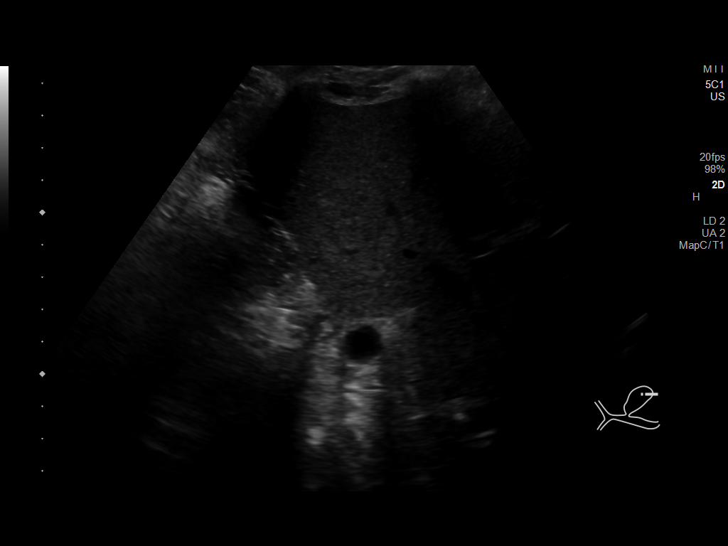
[im 12/70]
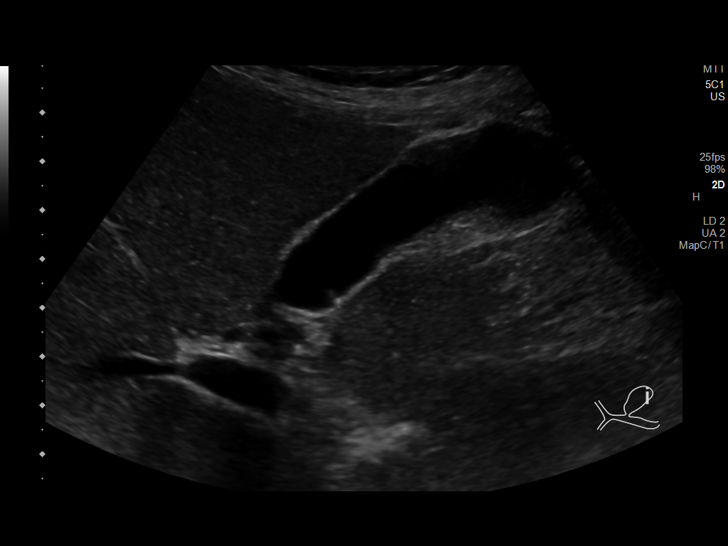
[im 18/70]
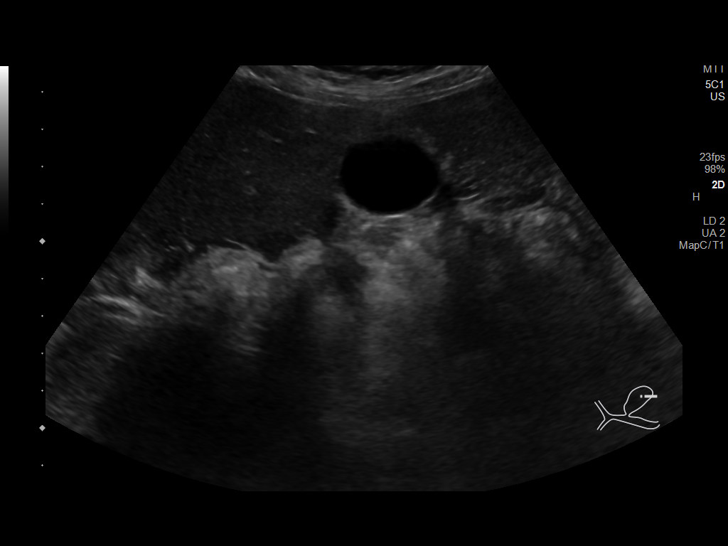
[im 24/70]
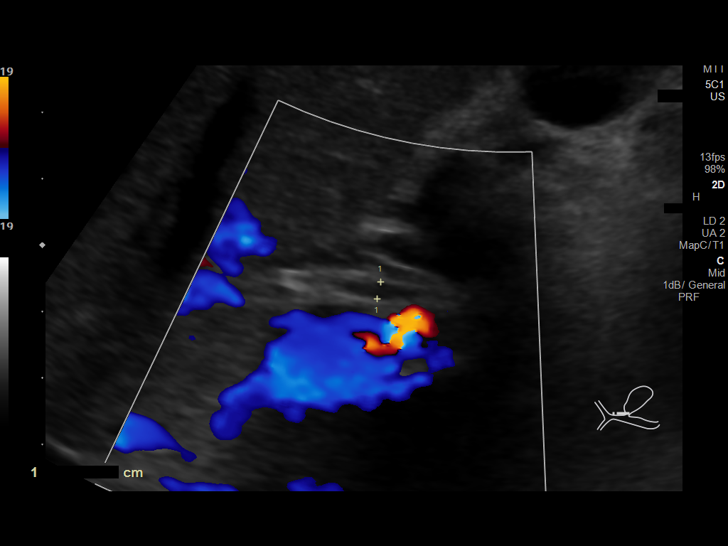
[im 26/70]
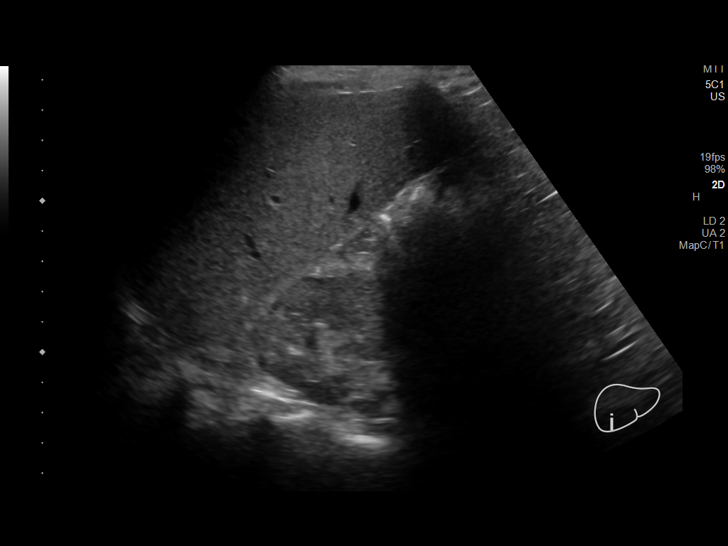
[im 32/70]
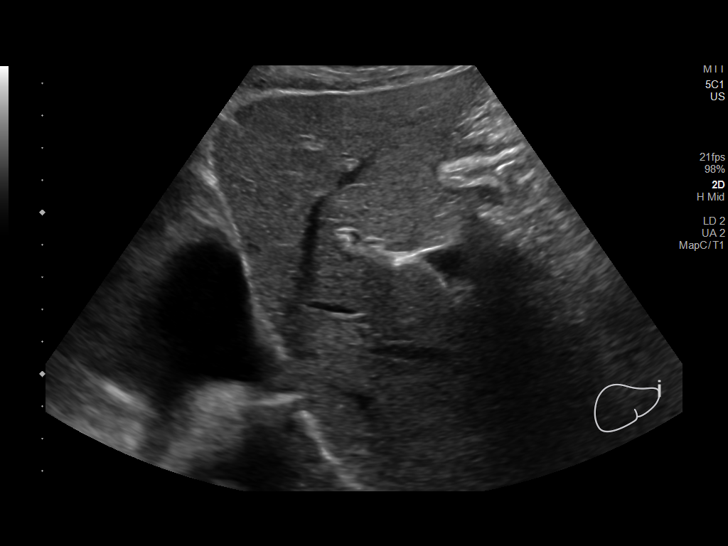
[im 38/70]
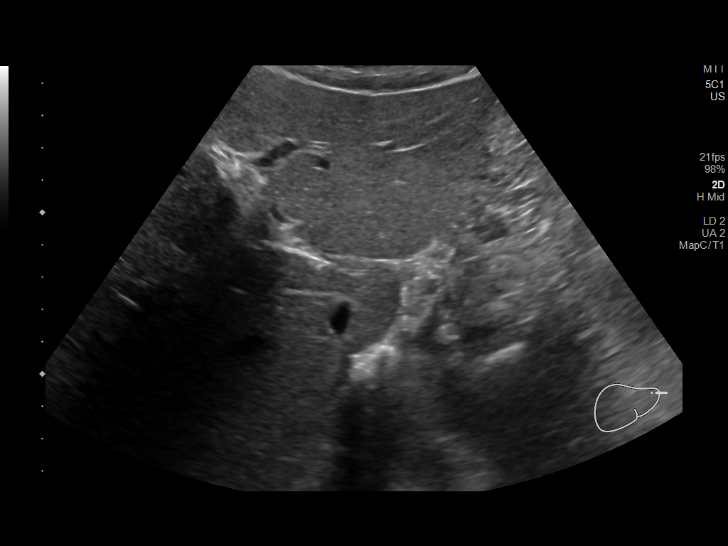
[im 44/70]
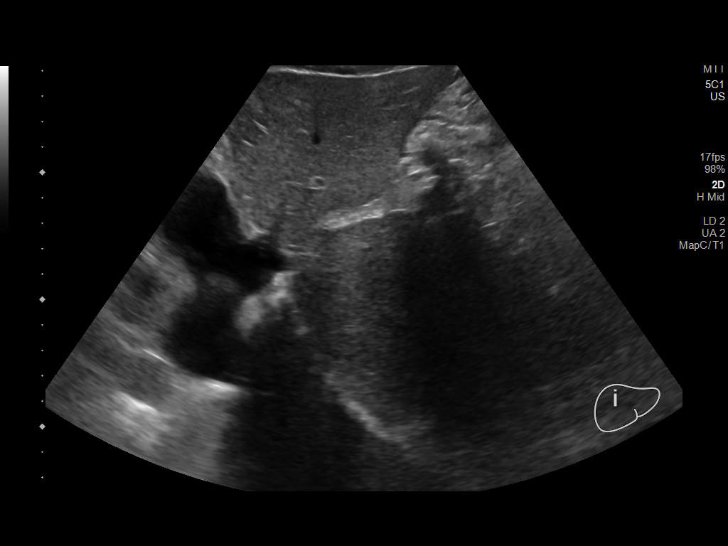
[im 47/70]
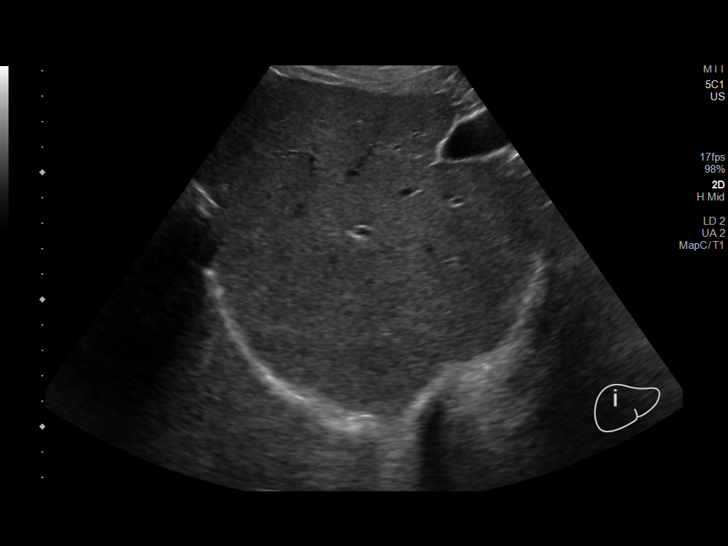
[im 52/70]
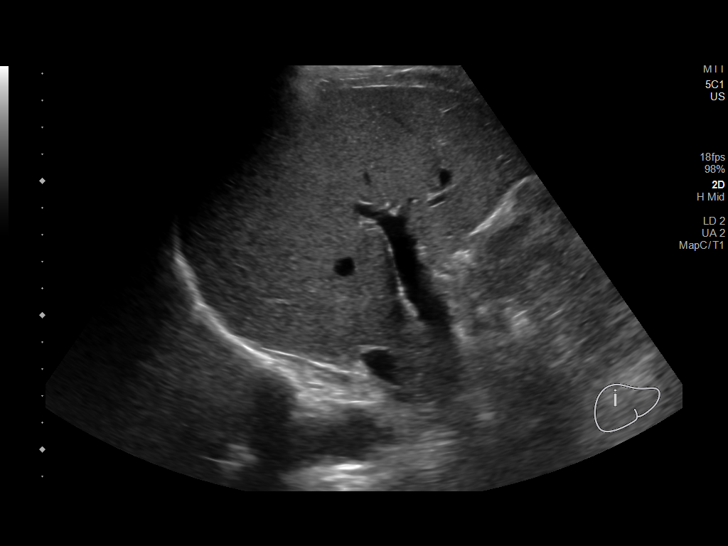
[im 58/70]
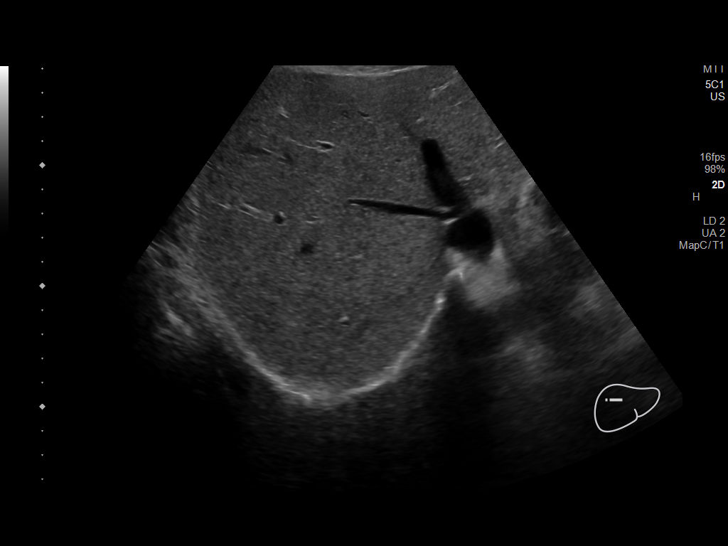
[im 64/70]
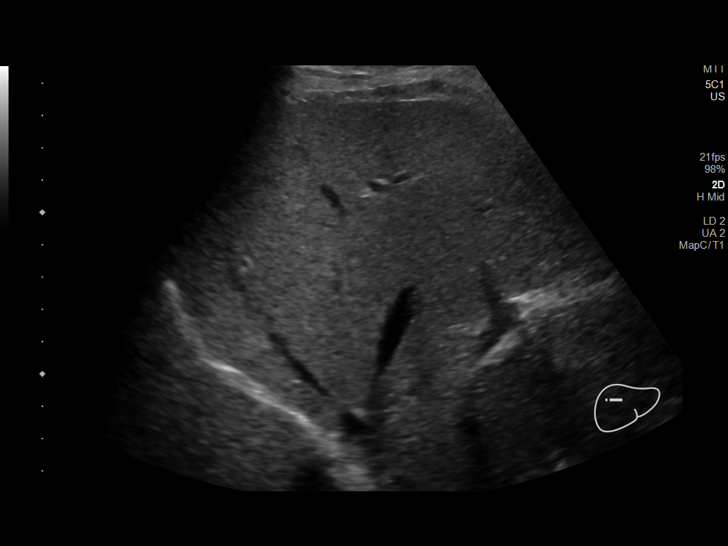
[im 70/70]
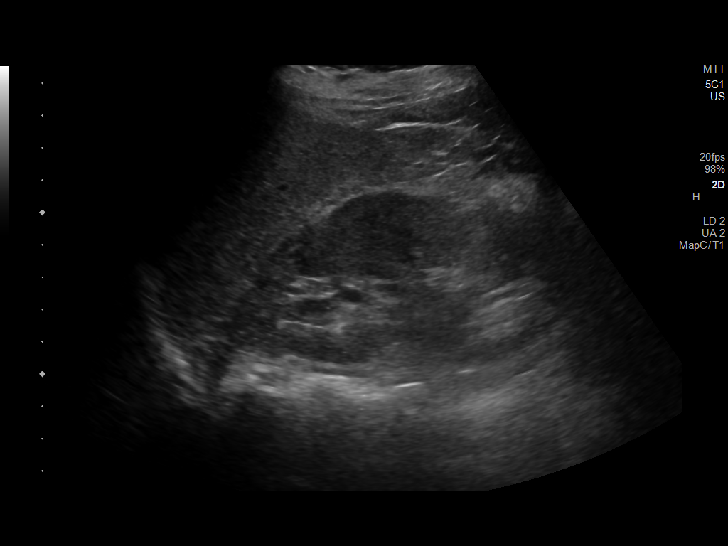

[14 of 25 positions shown; findings below may reference images not displayed]

FINDINGS: Gallbladder:

No gallstones or wall thickening visualized. No sonographic Murphy
sign noted by sonographer.

Common bile duct:

Diameter: 0.3 cm

Liver:

No focal lesion identified. Within normal limits in parenchymal
echogenicity. Portal vein is patent on color Doppler imaging with
normal direction of blood flow towards the liver.
IMPRESSION: Normal exam.  Negative for gallstones.

## 2021-11-01 ENCOUNTER — Emergency Department (HOSPITAL_BASED_OUTPATIENT_CLINIC_OR_DEPARTMENT_OTHER): Payer: Self-pay

## 2021-11-01 ENCOUNTER — Emergency Department (HOSPITAL_BASED_OUTPATIENT_CLINIC_OR_DEPARTMENT_OTHER)
Admission: EM | Admit: 2021-11-01 | Discharge: 2021-11-01 | Disposition: A | Discharge status: Home / Self Care | Payer: Self-pay | Attending: Emergency Medicine | Admitting: Emergency Medicine

## 2021-11-01 ENCOUNTER — Encounter (HOSPITAL_BASED_OUTPATIENT_CLINIC_OR_DEPARTMENT_OTHER): Payer: Self-pay

## 2021-11-01 ENCOUNTER — Other Ambulatory Visit: Payer: Self-pay

## 2021-11-01 DIAGNOSIS — F10920 Alcohol use, unspecified with intoxication, uncomplicated: Secondary | ICD-10-CM | POA: Insufficient documentation

## 2021-11-01 DIAGNOSIS — R0789 Other chest pain: Secondary | ICD-10-CM | POA: Insufficient documentation

## 2021-11-01 DIAGNOSIS — Y908 Blood alcohol level of 240 mg/100 ml or more: Secondary | ICD-10-CM | POA: Insufficient documentation

## 2021-11-01 DIAGNOSIS — Z87891 Personal history of nicotine dependence: Secondary | ICD-10-CM | POA: Insufficient documentation

## 2021-11-01 LAB — BASIC METABOLIC PANEL
Anion gap: 9 (ref 5–15)
BUN: 7 mg/dL (ref 6–20)
CO2: 25 mmol/L (ref 22–32)
Calcium: 9.1 mg/dL (ref 8.9–10.3)
Chloride: 105 mmol/L (ref 98–111)
Creatinine, Ser: 0.67 mg/dL (ref 0.44–1.00)
GFR, Estimated: >60 mL/min (ref >60)
Glucose, Bld: 94 mg/dL (ref 70–99)
Potassium: 3.4 mmol/L — ABNORMAL LOW (ref 3.5–5.1)
Sodium: 139 mmol/L (ref 135–145)

## 2021-11-01 LAB — CBC
HCT: 33.6 % — ABNORMAL LOW (ref 36.0–46.0)
Hemoglobin: 11.9 g/dL — ABNORMAL LOW (ref 12.0–15.0)
MCH: 32.4 pg (ref 26.0–34.0)
MCHC: 35.4 g/dL (ref 30.0–36.0)
MCV: 91.6 fL (ref 80.0–100.0)
Platelets: 228 10*3/uL (ref 150–400)
RBC: 3.67 MIL/uL — ABNORMAL LOW (ref 3.87–5.11)
RDW: 13.3 % (ref 11.5–15.5)
WBC: 6.5 10*3/uL (ref 4.0–10.5)
nRBC: 0 % (ref 0.0–0.2)

## 2021-11-01 LAB — ETHANOL: Alcohol, Ethyl (B): 293 mg/dL — ABNORMAL HIGH (ref <10)

## 2021-11-01 LAB — TROPONIN I (HIGH SENSITIVITY): Troponin I (High Sensitivity): 9 ng/L (ref <18)

## 2021-11-01 MED ORDER — PANTOPRAZOLE SODIUM 40 MG IV SOLR
40.0000 mg | Freq: Once | INTRAVENOUS | Status: AC
Start: 2021-11-01 — End: 2021-11-01
  Administered 2021-11-01: 40 mg via INTRAVENOUS
  Filled 2021-11-01: qty 10

## 2021-11-01 NOTE — ED Triage Notes (Addendum)
Pt to ED by POV from home c/o CP onset of 3 days ago dull in nature. No cardiac hx. Arrives A+O, pt is tearful in triage. Pt also endorses alcohol consumption this evening.

## 2021-11-01 NOTE — ED Provider Notes (Signed)
[facility] Provider Note: Lowella Dell, MD, FACEP  CSN: [id number] MRN: [id number] ARRIVAL: 11/01/21 at 0012 ROOM: [facility]   CHIEF COMPLAINT  Chest Pain   HISTORY OF PRESENT ILLNESS  11/01/21 3:01 AM Carol Ramirez is a 61 y.o. female who has had 3 days of chest pain.  She describes the chest pain as both a pressure and a sensation that she has a pulled muscle.  She is vague about the timing as states the pain is constant but she also that it comes and goes.  She rates the pain as a 3 out of 10 and it is worse with certain movements.  It is not worse with deep breathing.  It is not associated with shortness of breath, nausea or diaphoresis.  She does have a history of GERD but only takes over-the-counter medications.  She acknowledges drinking alcohol earlier.   Past Medical History:  Diagnosis Date   Depression    briefly in year 08/07/98 releated to work and death of family members.    Past Surgical History:  Procedure Laterality Date   CESAREAN SECTION      Family History  Problem Relation Age of Onset   Diabetes Mother     Social History   Tobacco Use   Smoking status: Former    Packs/day: 0.25    Years: 10.00    Total pack years: 2.50    Types: Cigarettes    Quit date: 08/08/2008    Years since quitting: 13.2   Smokeless tobacco: Never  Substance Use Topics   Alcohol use: Yes    Alcohol/week: 3.0 standard drinks of alcohol    Types: 3 Cans of beer per week    Comment: per day   Drug use: No    Prior to Admission medications   Not on File    Allergies Patient has no known allergies.   REVIEW OF SYSTEMS  Negative except as noted here or in the History of Present Illness.   PHYSICAL EXAMINATION  Initial Vital Signs Blood pressure (!) 165/88, pulse 73, temperature 98.2 F (36.8 C), temperature source Oral, resp. rate 16, SpO2 100 %.  Examination General: Well-developed, well-nourished female in no acute distress; appearance  consistent with age of record HENT: normocephalic; atraumatic Eyes: Normal appearance Neck: supple Heart: regular rate and rhythm Lungs: clear to auscultation bilaterally Chest: Nontender Abdomen: soft; nondistended; nontender; bowel sounds present Extremities: No deformity; full range of motion Neurologic: Awake, alert and oriented; motor function intact in all extremities and symmetric; no facial droop Skin: Warm and dry Psychiatric: Normal mood and affect   RESULTS  Summary of this visit's results, reviewed and interpreted by myself:   EKG Interpretation  Date/Time:  Tuesday November 01 2021 00:20:06 EDT Ventricular Rate:  94 PR Interval:  172 QRS Duration: 92 QT Interval:  374 QTC Calculation: 467 R Axis:   54 Text Interpretation: Normal sinus rhythm Cannot rule out Anteroseptal infarct , age undetermined Abnormal ECG Confirmed by Paula Libra ([id number]) on 11/01/2021 3:01:06 AM       Laboratory Studies: Results for orders placed or performed during the hospital encounter of 11/01/21 (from the past 24 hour(s))  Basic metabolic panel     Status: Abnormal   Collection Time: 11/01/21  2:52 AM  Result Value Ref Range   Sodium 139 135 - 145 mmol/L   Potassium 3.4 (L) 3.5 - 5.1 mmol/L   Chloride 105 98 - 111 mmol/L   CO2 25 22 - 32  mmol/L   Glucose, Bld 94 70 - 99 mg/dL   BUN 7 6 - 20 mg/dL   Creatinine, Ser 3.47 0.44 - 1.00 mg/dL   Calcium 9.1 8.9 - 42.5 mg/dL   GFR, Estimated >95 >63 mL/min   Anion gap 9 5 - 15  CBC     Status: Abnormal   Collection Time: 11/01/21  2:52 AM  Result Value Ref Range   WBC 6.5 4.0 - 10.5 K/uL   RBC 3.67 (L) 3.87 - 5.11 MIL/uL   Hemoglobin 11.9 (L) 12.0 - 15.0 g/dL   HCT 87.5 (L) 64.3 - 32.9 %   MCV 91.6 80.0 - 100.0 fL   MCH 32.4 26.0 - 34.0 pg   MCHC 35.4 30.0 - 36.0 g/dL   RDW 51.8 84.1 - 66.0 %   Platelets 228 150 - 400 K/uL   nRBC 0.0 0.0 - 0.2 %  Troponin I (High Sensitivity)     Status: None   Collection Time: 11/01/21  2:52 AM   Result Value Ref Range   Troponin I (High Sensitivity) 9 <18 ng/L  Ethanol     Status: Abnormal   Collection Time: 11/01/21  3:16 AM  Result Value Ref Range   Alcohol, Ethyl (B) 293 (H) <10 mg/dL   Imaging Studies: DG Chest 2 View  Result Date: 11/01/2021 CLINICAL DATA:  Chest pain. EXAM: CHEST - 2 VIEW COMPARISON:  07/01/2011 FINDINGS: The heart size and mediastinal contours are within normal limits. No consolidation, effusion, or pneumothorax. No acute osseous abnormality. IMPRESSION: No active cardiopulmonary disease. Electronically Signed   By: Thornell Sartorius M.D.   On: 11/01/2021 00:38    ED COURSE and MDM  Nursing notes, initial and subsequent vitals signs, including pulse oximetry, reviewed and interpreted by myself.  Vitals:   11/01/21 0024 11/01/21 0251  BP: (!) 159/89 (!) 165/88  Pulse: 88 73  Resp: 16 16  Temp: 98.2 F (36.8 C)   TempSrc: Oral   SpO2: 99% 100%   Medications  pantoprazole (PROTONIX) injection 40 mg (has no administration in time range)   3:43 AM HEART score 3.  Pain worse with certain positions and movements.  I suspect this represents chest wall pain.  The pain has been present for 3 days and there is no bump in troponin or acute ischemic changes on EKG.  She is also significantly intoxicated and may be having some gastritis or GERD secondary to alcohol consumption.   PROCEDURES  Procedures   ED DIAGNOSES     ICD-10-CM   1. Chest wall pain  R07.89     2. Alcoholic intoxication without complication (HCC)  F10.920          Roshelle Traub, Jonny Ruiz, MD 11/01/21 [phone]
